# Patient Record
Sex: Male | Born: 1950 | Race: Black or African American | Marital: Single
Health system: Southern US, Community
[De-identification: ages and names within clinical notes are randomized; demographics above are authoritative.]

---

## 2021-04-05 ENCOUNTER — Inpatient Hospital Stay
Admission: EM | Admit: 2021-04-05 | Discharge: 2021-04-07 | DRG: 312 | Disposition: A | Discharge status: Home / Self Care | Payer: Medicare Other | Attending: Internal Medicine | Admitting: Internal Medicine

## 2021-04-05 ENCOUNTER — Emergency Department: Payer: Medicare Other

## 2021-04-05 DIAGNOSIS — W1839XA Other fall on same level, initial encounter: Secondary | ICD-10-CM | POA: Diagnosis present

## 2021-04-05 DIAGNOSIS — Z79899 Other long term (current) drug therapy: Secondary | ICD-10-CM

## 2021-04-05 DIAGNOSIS — I499 Cardiac arrhythmia, unspecified: Secondary | ICD-10-CM | POA: Diagnosis present

## 2021-04-05 DIAGNOSIS — F1490 Cocaine use, unspecified, uncomplicated: Secondary | ICD-10-CM | POA: Diagnosis present

## 2021-04-05 DIAGNOSIS — F10921 Alcohol use, unspecified with intoxication delirium: Secondary | ICD-10-CM | POA: Diagnosis not present

## 2021-04-05 DIAGNOSIS — D649 Anemia, unspecified: Secondary | ICD-10-CM | POA: Diagnosis present

## 2021-04-05 DIAGNOSIS — D72829 Elevated white blood cell count, unspecified: Secondary | ICD-10-CM | POA: Diagnosis present

## 2021-04-05 DIAGNOSIS — Z9842 Cataract extraction status, left eye: Secondary | ICD-10-CM

## 2021-04-05 DIAGNOSIS — N179 Acute kidney failure, unspecified: Secondary | ICD-10-CM | POA: Diagnosis not present

## 2021-04-05 DIAGNOSIS — E872 Acidosis: Secondary | ICD-10-CM | POA: Diagnosis not present

## 2021-04-05 DIAGNOSIS — I951 Orthostatic hypotension: Principal | ICD-10-CM | POA: Diagnosis present

## 2021-04-05 DIAGNOSIS — Y904 Blood alcohol level of 80-99 mg/100 ml: Secondary | ICD-10-CM | POA: Diagnosis present

## 2021-04-05 DIAGNOSIS — Y92511 Restaurant or cafe as the place of occurrence of the external cause: Secondary | ICD-10-CM

## 2021-04-05 DIAGNOSIS — R55 Syncope and collapse: Secondary | ICD-10-CM | POA: Diagnosis not present

## 2021-04-05 DIAGNOSIS — Z20822 Contact with and (suspected) exposure to covid-19: Secondary | ICD-10-CM | POA: Diagnosis present

## 2021-04-05 DIAGNOSIS — F10129 Alcohol abuse with intoxication, unspecified: Secondary | ICD-10-CM | POA: Diagnosis present

## 2021-04-05 DIAGNOSIS — R531 Weakness: Secondary | ICD-10-CM

## 2021-04-05 DIAGNOSIS — N1831 Chronic kidney disease, stage 3a: Secondary | ICD-10-CM | POA: Diagnosis present

## 2021-04-05 DIAGNOSIS — Z7982 Long term (current) use of aspirin: Secondary | ICD-10-CM

## 2021-04-05 DIAGNOSIS — Z8249 Family history of ischemic heart disease and other diseases of the circulatory system: Secondary | ICD-10-CM

## 2021-04-05 DIAGNOSIS — Z9841 Cataract extraction status, right eye: Secondary | ICD-10-CM

## 2021-04-05 DIAGNOSIS — I129 Hypertensive chronic kidney disease with stage 1 through stage 4 chronic kidney disease, or unspecified chronic kidney disease: Secondary | ICD-10-CM | POA: Diagnosis present

## 2021-04-05 DIAGNOSIS — I440 Atrioventricular block, first degree: Secondary | ICD-10-CM | POA: Diagnosis present

## 2021-04-05 LAB — COMPREHENSIVE METABOLIC PANEL
ALT: 13 U/L (ref 0–44)
AST: 17 U/L (ref 15–41)
Albumin: 3.3 g/dL — ABNORMAL LOW (ref 3.5–5.0)
Alkaline Phosphatase: 56 U/L (ref 38–126)
Anion gap: 11 (ref 5–15)
BUN: 35 mg/dL — ABNORMAL HIGH (ref 8–23)
CO2: 16 mmol/L — ABNORMAL LOW (ref 22–32)
Calcium: 8 mg/dL — ABNORMAL LOW (ref 8.9–10.3)
Chloride: 110 mmol/L (ref 98–111)
Creatinine, Ser: 1.76 mg/dL — ABNORMAL HIGH (ref 0.61–1.24)
GFR, Estimated: 42 mL/min — ABNORMAL LOW (ref >60)
Glucose, Bld: 102 mg/dL — ABNORMAL HIGH (ref 70–99)
Potassium: 3.9 mmol/L (ref 3.5–5.1)
Sodium: 137 mmol/L (ref 135–145)
Total Bilirubin: 0.7 mg/dL (ref 0.3–1.2)
Total Protein: 6.4 g/dL — ABNORMAL LOW (ref 6.5–8.1)

## 2021-04-05 LAB — ETHANOL: Alcohol, Ethyl (B): 91 mg/dL — ABNORMAL HIGH (ref <10)

## 2021-04-05 LAB — CBC
HCT: 38.4 % — ABNORMAL LOW (ref 39.0–52.0)
Hemoglobin: 11.9 g/dL — ABNORMAL LOW (ref 13.0–17.0)
MCH: 29.1 pg (ref 26.0–34.0)
MCHC: 31 g/dL (ref 30.0–36.0)
MCV: 93.9 fL (ref 80.0–100.0)
Platelets: 345 10*3/uL (ref 150–400)
RBC: 4.09 MIL/uL — ABNORMAL LOW (ref 4.22–5.81)
RDW: 14.5 % (ref 11.5–15.5)
WBC: 9.7 10*3/uL (ref 4.0–10.5)
nRBC: 0 % (ref 0.0–0.2)

## 2021-04-05 LAB — TROPONIN I (HIGH SENSITIVITY): Troponin I (High Sensitivity): 4 ng/L (ref <18)

## 2021-04-05 LAB — CBG MONITORING, ED: Glucose-Capillary: 109 mg/dL — ABNORMAL HIGH (ref 70–99)

## 2021-04-05 LAB — RESP PANEL BY RT-PCR (FLU A&B, COVID) ARPGX2
Influenza A by PCR: NEGATIVE
Influenza B by PCR: NEGATIVE
SARS Coronavirus 2 by RT PCR: NEGATIVE

## 2021-04-05 MED ORDER — NALOXONE HCL 2 MG/2ML IJ SOSY: 0.5000 mg | PREFILLED_SYRINGE | Freq: Once | INTRAMUSCULAR | Status: AC

## 2021-04-05 MED ORDER — SODIUM CHLORIDE 0.9 % IV BOLUS
1000.0000 mL | Freq: Once | INTRAVENOUS | Status: AC
  Administered 2021-04-05: 1000 mL via INTRAVENOUS

## 2021-04-05 MED ORDER — TRAZODONE HCL 50 MG PO TABS: 25.0000 mg | ORAL_TABLET | Freq: Every evening | ORAL | Status: DC | PRN

## 2021-04-05 MED ORDER — MAGNESIUM HYDROXIDE 400 MG/5ML PO SUSP: 30.0000 mL | Freq: Every day | ORAL | Status: DC | PRN

## 2021-04-05 MED ORDER — ACETAMINOPHEN 650 MG RE SUPP: 650.0000 mg | Freq: Four times a day (QID) | RECTAL | Status: DC | PRN

## 2021-04-05 MED ORDER — ONDANSETRON HCL 4 MG PO TABS: 4.0000 mg | ORAL_TABLET | Freq: Four times a day (QID) | ORAL | Status: DC | PRN

## 2021-04-05 MED ORDER — ASPIRIN EC 81 MG PO TBEC
81.0000 mg | DELAYED_RELEASE_TABLET | Freq: Every day | ORAL | Status: DC
  Administered 2021-04-06 – 2021-04-07 (×2): 81 mg via ORAL
  Filled 2021-04-05 (×2): qty 1

## 2021-04-05 MED ORDER — ACETAMINOPHEN 325 MG PO TABS
650.0000 mg | ORAL_TABLET | Freq: Four times a day (QID) | ORAL | Status: DC | PRN
  Administered 2021-04-06 – 2021-04-07 (×5): 650 mg via ORAL
  Filled 2021-04-05 (×5): qty 2

## 2021-04-05 MED ORDER — NALOXONE HCL 2 MG/2ML IJ SOSY
PREFILLED_SYRINGE | INTRAMUSCULAR | Status: AC
  Administered 2021-04-05: 0.5 mg via INTRAVENOUS
  Filled 2021-04-05: qty 2

## 2021-04-05 MED ORDER — ONDANSETRON HCL 4 MG/2ML IJ SOLN: 4.0000 mg | Freq: Four times a day (QID) | INTRAMUSCULAR | Status: DC | PRN

## 2021-04-05 MED ORDER — SODIUM BICARBONATE 8.4 % IV SOLN
INTRAVENOUS | Status: DC
  Filled 2021-04-05 (×4): qty 1000

## 2021-04-05 MED ORDER — HYDROCHLOROTHIAZIDE 25 MG PO TABS: 25.0000 mg | ORAL_TABLET | Freq: Every day | ORAL | Status: DC

## 2021-04-05 MED ORDER — ENOXAPARIN SODIUM 60 MG/0.6ML IJ SOSY
0.5000 mg/kg | PREFILLED_SYRINGE | INTRAMUSCULAR | Status: DC
  Administered 2021-04-06 – 2021-04-07 (×2): 47.5 mg via SUBCUTANEOUS
  Filled 2021-04-05 (×2): qty 0.6

## 2021-04-05 MED ORDER — SODIUM BICARBONATE 650 MG PO TABS
1300.0000 mg | ORAL_TABLET | Freq: Every day | ORAL | Status: DC
  Administered 2021-04-06 – 2021-04-07 (×2): 1300 mg via ORAL
  Filled 2021-04-05 (×2): qty 2

## 2021-04-05 NOTE — ED Triage Notes (Signed)
Pt [facility]a via [facility] from [facility] c/o of syncopal episode. No med hx available per pt. EMS reports bp on route ranged from 84/34 to 67/41. Most recent bp on arrival was 54/46.   Pt received 500 cc bolus pta.

## 2021-04-05 NOTE — ED Provider Notes (Signed)
-----------------------------------------   11:07 PM on 04/05/2021 -----------------------------------------  Assuming care from Dr. Lenard Lance.  In short, Travis Payne is a 70 y.o. male with a chief complaint of hypotension and syncope.  Refer to the original H&P for additional details.  The current plan of care is to follow up head CT and admit.   ----------------------------------------- 11:20 PM on 04/05/2021 -----------------------------------------  Head CT unremarkable.  Consulting hospitalist for admission.  Also added on procalcitonin.    ----------------------------------------- 11:33 PM on 04/05/2021 -----------------------------------------  Discussed case by phone with Dr. Arville Care who will admit.   Loleta Rose, MD 04/05/21 2333

## 2021-04-05 NOTE — ED Notes (Signed)
0.5 mg narcan given verbal order @ 2134.

## 2021-04-05 NOTE — ED Notes (Signed)
Please cal Alexine (sister-in-law) with update [phone] or [phone]

## 2021-04-05 NOTE — H&P (Addendum)
[place]   PATIENT NAME: Travis Payne    MR#:  [id number]  DATE OF BIRTH:  [birth year]  DATE OF ADMISSION:  04/05/2021  PRIMARY CARE PHYSICIAN: No primary care provider on file.   Patient is coming from: Home  REQUESTING/REFERRING PHYSICIAN: Loleta Rose, MD  CHIEF COMPLAINT:   Chief Complaint  Patient presents with   Hypotension    HISTORY OF PRESENT ILLNESS:  Travis Payne is a 70 y.o. African-American male with medical history significant for essential hypertension, who presented to emergency room with acute onset of syncope with subsequent fall at [facility] where he was with a friend.  Upon arrival of EMS the patient was still on the ground and was found to be hypotensive with a blood pressure of 70/40.  He was started on IV fluids and brought to the emergency room.  He was having ultimate status on route to the hospital.  He was still quite somnolent in the ER but will awaken to voice and answer questions and would not fall back asleep.  No chest pain or dyspnea or palpitations.  No cough or wheezing or hemoptysis.  No fever or chills.  No dysuria, oliguria or hematuria or flank pain.  No nausea or vomiting or abdominal pain.  Upon arrival to the ER, blood pressure was 74/43 with pulse currently 93% on 3 L of O2 by nasal cannula.  With hydration blood pressure was up to 97/47 and later 108/66.  Labs revealed a BUN of 35 and creatinine 1.76 with no previous levels for comparison and CO2 of 16.  Albumin was 3.3 and total protein 6.4.  High-sensitivity troponin was 4 and later 6.  CBC showed mild anemia.  Influenza antigens and COVID-19 PCR came back negative.  Alcohol level came back 91.  EKG as reviewed by me : EKG showed normal sinus rhythm with a rate of 69 and prolonged PR interval. Imaging: Noncontrasted head CT scan revealed no acute intracranial abnormalities.  The patient was given 0.5 mg of IV Narcan and 2 L bolus of IV normal saline.  He will be  admitted to an observation medically monitored bed for further evaluation and management. PAST MEDICAL HISTORY:  Essential hypertension.  PAST SURGICAL HISTORY:  Bilateral cataract extraction.  SOCIAL HISTORY:   Social History   Tobacco Use   Smoking status: Not on file   Smokeless tobacco: Not on file  Substance Use Topics   Alcohol use: Not on file  No history of tobacco EtOH abuse or illicit drug use.  FAMILY HISTORY:  Positive for hypertension.  DRUG ALLERGIES:  No Known Allergies  REVIEW OF SYSTEMS:   ROS As per history of present illness. All pertinent systems were reviewed above. Constitutional, HEENT, cardiovascular, respiratory, GI, GU, musculoskeletal, neuro, psychiatric, endocrine, integumentary and hematologic systems were reviewed and are otherwise negative/unremarkable except for positive findings mentioned above in the HPI.   MEDICATIONS AT HOME:   Prior to Admission medications   Medication Sig Start Date End Date Taking? Authorizing Provider  aspirin EC 81 MG tablet Take 81 mg by mouth daily. Swallow whole.   Yes [provider]  hydrochlorothiazide (HYDRODIURIL) 25 MG tablet Take 25 mg by mouth daily.   Yes [provider]  sodium bicarbonate 650 MG tablet Take 1,300 mg by mouth daily.   Yes [provider]      VITAL SIGNS:  Blood pressure (!) 97/47, pulse 78, temperature 97.8 F (36.6 C), temperature source Oral, resp.  rate 17, height 5\' 9"  (1.753 m), weight 94 kg, SpO2 98 %.  PHYSICAL EXAMINATION:  Physical Exam  GENERAL:  70 y.o.-year-old African-American male patient lying in the bed with no acute distress.  He was somnolent but easily arousable and would stay awake if engaged. EYES: Pupils equal, round, reactive to light and accommodation. No scleral icterus. Extraocular muscles intact.  HEENT: Head atraumatic, normocephalic. Oropharynx and nasopharynx clear.  NECK:  Supple, no jugular venous distention. No thyroid  enlargement, no tenderness.  LUNGS: Normal breath sounds bilaterally, no wheezing, rales,rhonchi or crepitation. No use of accessory muscles of respiration.  CARDIOVASCULAR: Regular rate and rhythm, S1, S2 normal. No murmurs, rubs, or gallops.  ABDOMEN: Soft, nondistended, nontender. Bowel sounds present. No organomegaly or mass.  EXTREMITIES: No pedal edema, cyanosis, or clubbing.  NEUROLOGIC: Cranial nerves II through XII are intact. Muscle strength 5/5 in all extremities. Sensation intact. Gait not checked.  PSYCHIATRIC: The patient is somnolent but arousable and when aroused he is alert and oriented x 3.  Normal affect and good eye contact. SKIN: No obvious rash, lesion, or ulcer.   LABORATORY PANEL:   CBC Recent Labs  Lab 04/05/21 2138  WBC 9.7  HGB 11.9*  HCT 38.4*  PLT 345   ------------------------------------------------------------------------------------------------------------------  Chemistries  Recent Labs  Lab 04/05/21 2138  NA 137  K 3.9  CL 110  CO2 16*  GLUCOSE 102*  BUN 35*  CREATININE 1.76*  CALCIUM 8.0*  AST 17  ALT 13  ALKPHOS 56  BILITOT 0.7   ------------------------------------------------------------------------------------------------------------------  Cardiac Enzymes No results for input(s): TROPONINI in the last 168 hours. ------------------------------------------------------------------------------------------------------------------  RADIOLOGY:  CT Head Wo Contrast  Result Date: 04/05/2021 CLINICAL DATA:  Syncope, hypotension EXAM: CT HEAD WITHOUT CONTRAST TECHNIQUE: Contiguous axial images were obtained from the base of the skull through the vertex without intravenous contrast. COMPARISON:  None. FINDINGS: Brain: No acute infarct or hemorrhage. Lateral ventricles and midline structures are unremarkable. No acute extra-axial fluid collections. No mass effect. Vascular: No hyperdense vessel or unexpected calcification. Skull: Normal.  Negative for fracture or focal lesion. Sinuses/Orbits: No acute finding. Other: None. IMPRESSION: 1. No acute intracranial process. Electronically Signed   By: 04/07/2021 M.D.   On: 04/05/2021 23:12      IMPRESSION AND PLAN:  Active Problems:   Syncope  1.  Syncope.  - The patient will be admitted to an observation medically monitored bed. - Will check orthostatics q 12 hours. - Will obtain a bilateral carotid Doppler and 2D echo. - The patient will be hydrated and monitored for arrhythmias. Differential diagnosis would include likely orthostatic hypotension, with other possibilities including neurally mediated syncope, cardiogenic, arrhythmias related,  and less likely hypoglycemia.  2.  Acute kidney injury with associated metabolic acidosis.  This could be possibly on top of chronic kidney disease.  The patient had associated hypotension that could exacerbate acute kidney injury. - The patient will be hydrated with IV bicarbonate drip for now and will follow BMP. - With improvement of his CO2, hydration can be converted to normal saline or lactated Ringer. - We will hold off HCTZ.  3.  Alcohol intoxication. - This is certainly contributing to the patient's somnolence. - We will monitor for alcohol withdrawal.  4.  Essential hypertension. - The patient will be placed on as needed IV labetalol and will hold off HCTZ.   DVT prophylaxis: Lovenox. Code Status: full code. Family Communication:  The plan of care was discussed in details with  the patient (and family). I answered all questions. The patient agreed to proceed with the above mentioned plan. Further management will depend upon hospital course. Disposition Plan: Back to previous home environment Consults called: none. All the records are reviewed and case discussed with ED provider.  Status is: Observation  The patient remains OBS appropriate and will d/c before 2 midnights.  Dispo: The patient is from: Home               Anticipated d/c is to: Home              Patient currently is not medically stable to d/c.   Difficult to place patient No  TOTAL TIME TAKING CARE OF THIS PATIENT: 55 minutes.    Hannah Beat M.D on 04/05/2021 at 11:45 PM  [facility]   From 7 PM-7 AM, contact night-coverage [email or web address]  CC: Primary care physician; No primary care provider on file.

## 2021-04-05 NOTE — ED Notes (Signed)
BS 109, MD notified at bedside.

## 2021-04-05 NOTE — ED Provider Notes (Signed)
[facility] Emergency Department Provider Note  Time seen: 9:45 PM  I have reviewed the triage vital signs and the nursing notes.   HISTORY  Chief Complaint Hypotension   HPI Travis Payne is a 70 y.o. male with no known past medical history presents to the emergency department for a fall/syncope from [facility].  According to report patient was at [employer] with a friend of his when he had a syncopal episode fell to the ground.  EMS arrived patient was still on the ground, found to be hypotensive as low as 70/40, started on IV fluids and brought to the emergency department.  They state patient was altered throughout the transportation.  Here the patient is quite somnolent, will awaken to voice and answer questions but then falls back asleep if not actively engaged.  Patient does not recall falling or the events surrounding the syncopal episode.  Patient's responses are quite slow and he is quite somnolent.  Denies any chest pain or abdominal pain denies any vomiting or diarrhea.   History reviewed. No pertinent past medical history.  There are no problems to display for this patient.   Prior to Admission medications   Not on File    Not on File  History reviewed. No pertinent family history.  Social History    Review of Systems Constitutional: Negative for fever. Cardiovascular: Negative for chest pain. Respiratory: Negative for shortness of breath. Gastrointestinal: Negative for abdominal pain, vomiting and diarrhea. Genitourinary: Negative for urinary compaints Musculoskeletal: Negative for musculoskeletal complaints Neurological: Negative for headache All other ROS negative, although likely limited given the patient's somnolence and mild confusion although he is oriented x3.  ____________________________________________   PHYSICAL EXAM:  VITAL SIGNS: ED Triage Vitals  Enc Vitals Group     BP 04/05/21 2137 (!) 74/43     Pulse Rate  04/05/21 2137 70     Resp 04/05/21 2137 17     Temp 04/05/21 2137 97.8 F (36.6 C)     Temp Source 04/05/21 2137 Oral     SpO2 04/05/21 2137 93 %     Weight 04/05/21 2139 207 lb 3.7 oz (94 kg)     Height 04/05/21 2139 5\' 9"  (1.753 m)     Head Circumference --      Peak Flow --      Pain Score 04/05/21 2139 0     Pain Loc --      Pain Edu? --      Excl. in GC? --    Constitutional: Somnolent does awaken to voice and answers questions although slow responses.  Initially stated the year is 2012 but corrected to 2022. Eyes: Normal exam ENT      Head: Normocephalic and atraumatic.      Mouth/Throat: Mucous membranes are moist. Cardiovascular: Normal rate, regular rhythm. Respiratory: Normal respiratory effort without tachypnea nor retractions. Breath sounds are clear Gastrointestinal: Soft and nontender. No distention.   Musculoskeletal: Nontender with normal range of motion in all extremities. Neurologic:  Normal speech and language. No gross focal neurologic deficits Skin:  Skin is warm, dry and intact.  Psychiatric: Mood and affect are normal.   ____________________________________________    EKG  EKG viewed and interpreted by myself shows a normal sinus rhythm at 69 bpm with a narrow QRS, normal axis, normal intervals besides slight PR prolongation.  Nonspecific ST changes.  ____________________________________________    RADIOLOGY  CT scan pending  ____________________________________________   INITIAL IMPRESSION / ASSESSMENT AND PLAN /  ED COURSE  Pertinent labs & imaging results that were available during my care of the patient were reviewed by me and considered in my medical decision making (see chart for details).   Patient presents to the emergency department from [facility] after a possible syncopal episode found on the ground, somnolent with hypotension.  Patient arrives continued hypotension as low as 55 systolic.  Open 2 L normal saline to bolus.  Patient  does have 2 mm pupils bilaterally we will dose Narcan to see if this helps with his condition.  We will obtain a CT scan of the head, labs including cardiac enzymes, and EKG, urinalysis and continue to closely monitor.  Patient's labs have resulted showing renal insufficiency, no old labs for comparison.  Given the renal insufficiency this could explain the patient's hypotension.  Patient received 2 L normal saline current blood pressure is 97/55.  We will dose an additional liter of fluids.  We will obtain CT imaging of the head as a precaution.  Patient is now much more awake alert.  Daughter is here with the patient who did not witness the syncopal event but states he was acting normal prior to leaving her house before he went to [employer].  Patient's troponin is negative.  Repeat troponin is pending.  Given the patient's profound initial hypotension and still fairly hypotensive despite IV resuscitation I believe patient will need to be admitted to the hospital for further work-up and treatment.  COVID/flu is negative.  Urinalysis pending.  CT scans pending.  Patient care signed out to oncoming provider.  Travis Payne was evaluated in Emergency Department on 04/05/2021 for the symptoms described in the history of present illness. He was evaluated in the context of the global COVID-19 pandemic, which necessitated consideration that the patient might be at risk for infection with the SARS-CoV-2 virus that causes COVID-19. Institutional protocols and algorithms that pertain to the evaluation of patients at risk for COVID-19 are in a state of rapid change based on information released by regulatory bodies including the CDC and federal and state organizations. These policies and algorithms were followed during the patient's care in the ED.  ____________________________________________   FINAL CLINICAL IMPRESSION(S) / ED DIAGNOSES  Altered mental status Hypotension Somnolence   Minna Antis, MD 04/05/21 2254

## 2021-04-05 NOTE — Progress Notes (Signed)
PHARMACIST - PHYSICIAN COMMUNICATION  CONCERNING:  Enoxaparin (Lovenox) for DVT Prophylaxis    RECOMMENDATION: Patient was prescribed enoxaprin 40mg  q24 hours for VTE prophylaxis.   Filed Weights   04/05/21 2139  Weight: 94 kg (207 lb 3.7 oz)    Body mass index is 30.6 kg/m.  Estimated Creatinine Clearance: 46.1 mL/min (A) (by C-G formula based on SCr of 1.76 mg/dL (H)).   Based on [facility] policy patient is candidate for enoxaparin 0.5mg /kg TBW SQ every 24 hours based on BMI being >30.  DESCRIPTION: Pharmacy has adjusted enoxaparin dose per [facility] policy.  Patient is now receiving enoxaparin 0.5 mg/kg every 24 hours   [facility], PharmD, [facility] 04/05/2021 11:41 PM

## 2021-04-06 ENCOUNTER — Observation Stay: Payer: Medicare Other

## 2021-04-06 ENCOUNTER — Observation Stay
Admit: 2021-04-06 | Discharge: 2021-04-06 | Disposition: A | Discharge status: Home / Self Care | Payer: Medicare Other | Attending: Family Medicine | Admitting: Family Medicine

## 2021-04-06 DIAGNOSIS — D72829 Elevated white blood cell count, unspecified: Secondary | ICD-10-CM | POA: Diagnosis present

## 2021-04-06 DIAGNOSIS — F1410 Cocaine abuse, uncomplicated: Secondary | ICD-10-CM | POA: Diagnosis not present

## 2021-04-06 DIAGNOSIS — W1839XA Other fall on same level, initial encounter: Secondary | ICD-10-CM | POA: Diagnosis present

## 2021-04-06 DIAGNOSIS — N1831 Chronic kidney disease, stage 3a: Secondary | ICD-10-CM | POA: Diagnosis present

## 2021-04-06 DIAGNOSIS — I129 Hypertensive chronic kidney disease with stage 1 through stage 4 chronic kidney disease, or unspecified chronic kidney disease: Secondary | ICD-10-CM | POA: Diagnosis present

## 2021-04-06 DIAGNOSIS — I440 Atrioventricular block, first degree: Secondary | ICD-10-CM | POA: Diagnosis present

## 2021-04-06 DIAGNOSIS — F1490 Cocaine use, unspecified, uncomplicated: Secondary | ICD-10-CM

## 2021-04-06 DIAGNOSIS — F10129 Alcohol abuse with intoxication, unspecified: Secondary | ICD-10-CM | POA: Diagnosis present

## 2021-04-06 DIAGNOSIS — I951 Orthostatic hypotension: Secondary | ICD-10-CM | POA: Diagnosis present

## 2021-04-06 DIAGNOSIS — R55 Syncope and collapse: Secondary | ICD-10-CM

## 2021-04-06 DIAGNOSIS — Z9842 Cataract extraction status, left eye: Secondary | ICD-10-CM | POA: Diagnosis not present

## 2021-04-06 DIAGNOSIS — E872 Acidosis: Secondary | ICD-10-CM

## 2021-04-06 DIAGNOSIS — N179 Acute kidney failure, unspecified: Secondary | ICD-10-CM | POA: Diagnosis present

## 2021-04-06 DIAGNOSIS — I499 Cardiac arrhythmia, unspecified: Secondary | ICD-10-CM | POA: Diagnosis present

## 2021-04-06 DIAGNOSIS — Y92511 Restaurant or cafe as the place of occurrence of the external cause: Secondary | ICD-10-CM | POA: Diagnosis not present

## 2021-04-06 DIAGNOSIS — Z9841 Cataract extraction status, right eye: Secondary | ICD-10-CM | POA: Diagnosis not present

## 2021-04-06 DIAGNOSIS — Z79899 Other long term (current) drug therapy: Secondary | ICD-10-CM | POA: Diagnosis not present

## 2021-04-06 DIAGNOSIS — D649 Anemia, unspecified: Secondary | ICD-10-CM | POA: Diagnosis present

## 2021-04-06 DIAGNOSIS — Z8249 Family history of ischemic heart disease and other diseases of the circulatory system: Secondary | ICD-10-CM | POA: Diagnosis not present

## 2021-04-06 DIAGNOSIS — Y904 Blood alcohol level of 80-99 mg/100 ml: Secondary | ICD-10-CM | POA: Diagnosis present

## 2021-04-06 DIAGNOSIS — Z7982 Long term (current) use of aspirin: Secondary | ICD-10-CM | POA: Diagnosis not present

## 2021-04-06 DIAGNOSIS — Z20822 Contact with and (suspected) exposure to covid-19: Secondary | ICD-10-CM | POA: Diagnosis present

## 2021-04-06 LAB — BASIC METABOLIC PANEL
Anion gap: 7 (ref 5–15)
BUN: 30 mg/dL — ABNORMAL HIGH (ref 8–23)
CO2: 20 mmol/L — ABNORMAL LOW (ref 22–32)
Calcium: 8.1 mg/dL — ABNORMAL LOW (ref 8.9–10.3)
Chloride: 111 mmol/L (ref 98–111)
Creatinine, Ser: 1.42 mg/dL — ABNORMAL HIGH (ref 0.61–1.24)
GFR, Estimated: 54 mL/min — ABNORMAL LOW (ref >60)
Glucose, Bld: 91 mg/dL (ref 70–99)
Potassium: 4.9 mmol/L (ref 3.5–5.1)
Sodium: 138 mmol/L (ref 135–145)

## 2021-04-06 LAB — URINALYSIS, ROUTINE W REFLEX MICROSCOPIC
Bacteria, UA: NONE SEEN
Bilirubin Urine: NEGATIVE
Glucose, UA: NEGATIVE mg/dL
Hgb urine dipstick: NEGATIVE
Ketones, ur: NEGATIVE mg/dL
Leukocytes,Ua: NEGATIVE
Nitrite: NEGATIVE
Protein, ur: 30 mg/dL — AB
Specific Gravity, Urine: 1.013 (ref 1.005–1.030)
pH: 6 (ref 5.0–8.0)

## 2021-04-06 LAB — ECHOCARDIOGRAM COMPLETE
AR max vel: 2.39 cm2
AV Area VTI: 2.7 cm2
AV Area mean vel: 2.5 cm2
AV Mean grad: 6 mmHg
AV Peak grad: 10.8 mmHg
Ao pk vel: 1.64 m/s
Area-P 1/2: 3.01 cm2
Height: 69 in
S' Lateral: 2.54 cm
Weight: 3232 oz

## 2021-04-06 LAB — CBC
HCT: 37 % — ABNORMAL LOW (ref 39.0–52.0)
Hemoglobin: 12.4 g/dL — ABNORMAL LOW (ref 13.0–17.0)
MCH: 31.2 pg (ref 26.0–34.0)
MCHC: 33.5 g/dL (ref 30.0–36.0)
MCV: 93 fL (ref 80.0–100.0)
Platelets: 321 10*3/uL (ref 150–400)
RBC: 3.98 MIL/uL — ABNORMAL LOW (ref 4.22–5.81)
RDW: 14.4 % (ref 11.5–15.5)
WBC: 19.6 10*3/uL — ABNORMAL HIGH (ref 4.0–10.5)
nRBC: 0 % (ref 0.0–0.2)

## 2021-04-06 LAB — TROPONIN I (HIGH SENSITIVITY): Troponin I (High Sensitivity): 6 ng/L (ref <18)

## 2021-04-06 LAB — URINE DRUG SCREEN, QUALITATIVE (ARMC ONLY)
Amphetamines, Ur Screen: NOT DETECTED
Barbiturates, Ur Screen: NOT DETECTED
Benzodiazepine, Ur Scrn: NOT DETECTED
Cannabinoid 50 Ng, Ur ~~LOC~~: NOT DETECTED
Cocaine Metabolite,Ur ~~LOC~~: POSITIVE — AB
MDMA (Ecstasy)Ur Screen: NOT DETECTED
Methadone Scn, Ur: NOT DETECTED
Opiate, Ur Screen: NOT DETECTED
Phencyclidine (PCP) Ur S: NOT DETECTED
Tricyclic, Ur Screen: NOT DETECTED

## 2021-04-06 LAB — HIV ANTIBODY (ROUTINE TESTING W REFLEX): HIV Screen 4th Generation wRfx: NONREACTIVE

## 2021-04-06 LAB — PROCALCITONIN: Procalcitonin: <0.1 ng/mL

## 2021-04-06 MED ORDER — LORAZEPAM 2 MG/ML IJ SOLN: 1.0000 mg | INTRAMUSCULAR | Status: DC | PRN

## 2021-04-06 MED ORDER — SODIUM BICARBONATE 8.4 % IV SOLN
INTRAVENOUS | Status: DC
  Filled 2021-04-06 (×2): qty 1000

## 2021-04-06 MED ORDER — SODIUM BICARBONATE 8.4 % IV SOLN
INTRAVENOUS | Status: DC
  Filled 2021-04-06: qty 150
  Filled 2021-04-06 (×3): qty 1000

## 2021-04-06 NOTE — Plan of Care (Signed)
  Problem: Elimination: Goal: Will not experience complications related to bowel motility Outcome: Progressing Goal: Will not experience complications related to urinary retention Outcome: Progressing   

## 2021-04-06 NOTE — Progress Notes (Signed)
   04/06/21 1138  Orthostatic Lying   BP- Lying 138/83  Pulse- Lying 84  Orthostatic Sitting  BP- Sitting 145/81  Pulse- Sitting 86  Orthostatic Standing at 0 minutes  BP- Standing at 0 minutes 127/81  Pulse- Standing at 0 minutes 95  Orthostatic Standing at 3 minutes  BP- Standing at 3 minutes 137/84  Pulse- Standing at 3 minutes 93

## 2021-04-06 NOTE — ED Notes (Signed)
Pt transported to room 216 by this RN.

## 2021-04-06 NOTE — ED Notes (Signed)
Travis Payne- daughter- requests call when pt has inpatient room. Number is [phone].

## 2021-04-06 NOTE — Progress Notes (Signed)
PROGRESS NOTE    Travis Payne  SWN:[id number] DOB: 1951/01/28 DOA: 04/05/2021 PCP: No primary care provider on file.  Assessment & Plan:   Active Problems:   Syncope  Syncope: etiology unclear, possibly arrhythmias vs orthostasis vs cocaine use. [place] b/l carotids show b/l carotid bifurcation plaque resulting in less than 50% diameter. Echo ordered. 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 Cr is trending down from day prior. Will continue to monitor   Metabolic acidosis: trending up from day prior. 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 Illicit drug use cessation counseling   Leukocytosis: etiology unclear. Reactive vs infectious. No fevers. Will hold on abxs at this time and monitor   Alcohol intoxication: alcohol cessation counseling   HTN: will continue to hold home dose of HCTZ. IV labetalol    DVT prophylaxis: lovenox  Code Status: full Family Communication: Disposition Plan: likely d/c back home   Level of care: Med-Surg  Status is: Inpatient  Remains inpatient appropriate because:IV treatments appropriate due to intensity of illness or inability to take PO and Inpatient level of care appropriate due to severity of illness  Dispo: The patient is from: Home              Anticipated d/c is to: Home              Patient currently is not medically stable to d/c.   Difficult to place patient: unclear    Consultants:    Procedures:   Antimicrobials:   Subjective: Pt c/o fatigue   Objective: Vitals:   04/06/21 0200 04/06/21 0230 04/06/21 0337 04/06/21 0455  BP: (!) 94/47 99/76 111/77 (!) 143/94  Pulse:   86 93  Resp: 15  15 16   Temp:   98.4 F (36.9 C) 98 F (36.7 C)  TempSrc:   Oral Oral  SpO2:   96% 98%  Weight:    91.6 kg  Height:    5\' 9"  (1.753 m)    Intake/Output Summary (Last 24 hours) at 04/06/2021 0754 Last data filed at 04/06/2021 0500 Gross per 24 hour  Intake  379.19 ml  Output 325 ml  Net 54.19 ml   Filed Weights   04/05/21 2139 04/06/21 0455  Weight: 94 kg 91.6 kg    Examination:  General exam: Appears calm and comfortable  Respiratory system: diminished breath sounds b/l  Cardiovascular system: S1 & S2 +. No  rubs, gallops or clicks.  Gastrointestinal system: Abdomen is nondistended, soft and nontender. Normal bowel sounds heard. Central nervous system: Alert and oriented. Moves all extremities  Psychiatry: Judgement and insight appear normal. Mood & affect appropriate.     Data Reviewed: I have personally reviewed following labs and imaging studies  CBC: Recent Labs  Lab 04/05/21 2138 04/06/21 0510  WBC 9.7 19.6*  HGB 11.9* 12.4*  HCT 38.4* 37.0*  MCV 93.9 93.0  PLT 345 321   Basic Metabolic Panel: Recent Labs  Lab 04/05/21 2138 04/06/21 0510  NA 137 138  K 3.9 4.9  CL 110 111  CO2 16* 20*  GLUCOSE 102* 91  BUN 35* 30*  CREATININE 1.76* 1.42*  CALCIUM 8.0* 8.1*   GFR: Estimated Creatinine Clearance: 56.5 mL/min (A) (by C-G formula based on SCr of 1.42 mg/dL (H)). Liver Function Tests: Recent Labs  Lab 04/05/21 2138  AST 17  ALT 13  ALKPHOS 56  BILITOT 0.7  PROT 6.4*  ALBUMIN 3.3*  No results for input(s): LIPASE, AMYLASE in the last 168 hours. No results for input(s): AMMONIA in the last 168 hours. Coagulation Profile: No results for input(s): INR, PROTIME in the last 168 hours. Cardiac Enzymes: No results for input(s): CKTOTAL, CKMB, CKMBINDEX, TROPONINI in the last 168 hours. BNP (last 3 results) No results for input(s): PROBNP in the last 8760 hours. HbA1C: No results for input(s): HGBA1C in the last 72 hours. CBG: Recent Labs  Lab 04/05/21 2133  GLUCAP 109*   Lipid Profile: No results for input(s): CHOL, HDL, LDLCALC, TRIG, CHOLHDL, LDLDIRECT in the last 72 hours. Thyroid Function Tests: No results for input(s): TSH, T4TOTAL, FREET4, T3FREE, THYROIDAB in the last 72 hours. Anemia  Panel: No results for input(s): VITAMINB12, FOLATE, FERRITIN, TIBC, IRON, RETICCTPCT in the last 72 hours. Sepsis Labs: Recent Labs  Lab 04/05/21 2351  PROCALCITON <0.10    Recent Results (from the past 240 hour(s))  Resp Panel by RT-PCR (Flu A&B, Covid) Nasopharyngeal Swab     Status: None   Collection Time: 04/05/21  9:38 PM   Specimen: Nasopharyngeal Swab; Nasopharyngeal(NP) swabs in vial transport medium  Result Value Ref Range Status   SARS Coronavirus 2 by RT PCR NEGATIVE NEGATIVE Final    Comment: (NOTE) SARS-CoV-2 target nucleic acids are NOT DETECTED.  The SARS-CoV-2 RNA is generally detectable in upper respiratory specimens during the acute phase of infection. The lowest concentration of SARS-CoV-2 viral copies this assay can detect is 138 copies/mL. A negative result does not preclude SARS-Cov-2 infection and should not be used as the sole basis for treatment or other patient management decisions. A negative result may occur with  improper specimen collection/handling, submission of specimen other than nasopharyngeal swab, presence of viral mutation(s) within the areas targeted by this assay, and inadequate number of viral copies(<138 copies/mL). A negative result must be combined with clinical observations, patient history, and epidemiological information. The expected result is Negative.  Fact Sheet for Patients:  BloggerCourse.com  Fact Sheet for Healthcare Providers:  SeriousBroker.it  This test is no t yet approved or cleared by the [place] FDA and  has been authorized for detection and/or diagnosis of SARS-CoV-2 by FDA under an Emergency Use Authorization (EUA). This EUA will remain  in effect (meaning this test can be used) for the duration of the COVID-19 declaration under Section 564(b)(1) of the Act, 21 U.S.C.section 360bbb-3(b)(1), unless the authorization is terminated  or revoked sooner.        Influenza A by PCR NEGATIVE NEGATIVE Final   Influenza B by PCR NEGATIVE NEGATIVE Final    Comment: (NOTE) The Xpert Xpress SARS-CoV-2/FLU/RSV plus assay is intended as an aid in the diagnosis of influenza from Nasopharyngeal swab specimens and should not be used as a sole basis for treatment. Nasal washings and aspirates are unacceptable for Xpert Xpress SARS-CoV-2/FLU/RSV testing.  Fact Sheet for Patients: BloggerCourse.com  Fact Sheet for Healthcare Providers: SeriousBroker.it  This test is not yet approved or cleared by the [place] FDA and has been authorized for detection and/or diagnosis of SARS-CoV-2 by FDA under an Emergency Use Authorization (EUA). This EUA will remain in effect (meaning this test can be used) for the duration of the COVID-19 declaration under Section 564(b)(1) of the Act, 21 U.S.C. section 360bbb-3(b)(1), unless the authorization is terminated or revoked.  Performed at [facility], [address], [place], [place] [address]          Radiology Studies: CT Head Wo Contrast  Result  Date: 04/05/2021 CLINICAL DATA:  Syncope, hypotension EXAM: CT HEAD WITHOUT CONTRAST TECHNIQUE: Contiguous axial images were obtained from the base of the skull through the vertex without intravenous contrast. COMPARISON:  None. FINDINGS: Brain: No acute infarct or hemorrhage. Lateral ventricles and midline structures are unremarkable. No acute extra-axial fluid collections. No mass effect. Vascular: No hyperdense vessel or unexpected calcification. Skull: Normal. Negative for fracture or focal lesion. Sinuses/Orbits: No acute finding. Other: None. IMPRESSION: 1. No acute intracranial process. Electronically Signed   By: Sharlet Salina M.D.   On: 04/05/2021 23:12        Scheduled Meds:  aspirin EC  81 mg Oral Daily   enoxaparin (LOVENOX) injection  0.5 mg/kg Subcutaneous Q24H   sodium bicarbonate  1,300  mg Oral Daily   Continuous Infusions:  dextrose 5 % 1,000 mL with sodium bicarbonate 150 mEq infusion 100 mL/hr at 04/06/21 0031     LOS: 0 days    Time spent: 33 mins    Charise Killian, MD [facility] Pager [phone]  If 7PM-7AM, please contact night-coverage 04/06/2021, 7:54 AM

## 2021-04-06 NOTE — ED Notes (Signed)
This RN to bedside, introduced self to patient. Pt alert and oriented. Pt placed back on cardiac monitor. Urinal emptied. Call bell remains within reach of patient at this time. Pt denies further needs.

## 2021-04-06 NOTE — ED Notes (Signed)
Pt assisted to toilet at this time 

## 2021-04-07 DIAGNOSIS — R55 Syncope and collapse: Secondary | ICD-10-CM | POA: Diagnosis not present

## 2021-04-07 DIAGNOSIS — N179 Acute kidney failure, unspecified: Secondary | ICD-10-CM

## 2021-04-07 DIAGNOSIS — F1410 Cocaine abuse, uncomplicated: Secondary | ICD-10-CM | POA: Diagnosis not present

## 2021-04-07 LAB — BASIC METABOLIC PANEL
Anion gap: 6 (ref 5–15)
BUN: 21 mg/dL (ref 8–23)
CO2: 25 mmol/L (ref 22–32)
Calcium: 8.7 mg/dL — ABNORMAL LOW (ref 8.9–10.3)
Chloride: 105 mmol/L (ref 98–111)
Creatinine, Ser: 1.38 mg/dL — ABNORMAL HIGH (ref 0.61–1.24)
GFR, Estimated: 56 mL/min — ABNORMAL LOW (ref >60)
Glucose, Bld: 96 mg/dL (ref 70–99)
Potassium: 3.8 mmol/L (ref 3.5–5.1)
Sodium: 136 mmol/L (ref 135–145)

## 2021-04-07 LAB — CBC
HCT: 36.4 % — ABNORMAL LOW (ref 39.0–52.0)
Hemoglobin: 12.1 g/dL — ABNORMAL LOW (ref 13.0–17.0)
MCH: 29.7 pg (ref 26.0–34.0)
MCHC: 33.2 g/dL (ref 30.0–36.0)
MCV: 89.4 fL (ref 80.0–100.0)
Platelets: 311 10*3/uL (ref 150–400)
RBC: 4.07 MIL/uL — ABNORMAL LOW (ref 4.22–5.81)
RDW: 14.3 % (ref 11.5–15.5)
WBC: 11.1 10*3/uL — ABNORMAL HIGH (ref 4.0–10.5)
nRBC: 0 % (ref 0.0–0.2)

## 2021-04-07 NOTE — Discharge Summary (Signed)
Physician Discharge Summary  Travis Payne DQQ:[id number] DOB: Jan 11, 1951 DOA: 04/05/2021  PCP: Pcp, No  Admit date: 04/05/2021 Discharge date: 04/07/2021  Admitted From: home Disposition:  home   Recommendations for Outpatient Follow-up:  Follow up with PCP in 1-2 weeks   Home Health: no Equipment/Devices: n/a  Discharge Condition: stable  CODE STATUS: full Diet recommendation: Heart Healthy   Brief/Interim Summary: HPI was taken from Dr. Arville Care: Travis Payne is a 70 y.o. African-American male with medical history significant for essential hypertension, who presented to emergency room with acute onset of syncope with subsequent fall at [facility] where he was with a friend.  Upon arrival of EMS the patient was still on the ground and was found to be hypotensive with a blood pressure of 70/40.  He was started on IV fluids and brought to the emergency room.  He was having ultimate status on route to the hospital.  He was still quite somnolent in the ER but will awaken to voice and answer questions and would not fall back asleep.  No chest pain or dyspnea or palpitations.  No cough or wheezing or hemoptysis.  No fever or chills.  No dysuria, oliguria or hematuria or flank pain.  No nausea or vomiting or abdominal pain.   Upon arrival to the ER, blood pressure was 74/43 with pulse currently 93% on 3 L of O2 by nasal cannula.  With hydration blood pressure was up to 97/47 and later 108/66.  Labs revealed a BUN of 35 and creatinine 1.76 with no previous levels for comparison and CO2 of 16.  Albumin was 3.3 and total protein 6.4.  High-sensitivity troponin was 4 and later 6.  CBC showed mild anemia.  Influenza antigens and COVID-19 PCR came back negative.  Alcohol level came back 91.   EKG as reviewed by me : EKG showed normal sinus rhythm with a rate of 69 and prolonged PR interval. Imaging: Noncontrasted head CT scan revealed no acute intracranial abnormalities.  The patient was given  0.5 mg of IV Narcan and 2 L bolus of IV normal saline.  He will be admitted to an observation medically monitored bed for further evaluation and management.  Hospital course from Dr. Mayford Knife 7/17-18/22: Pt presented after a syncopal episode. [place] b/l carotids showed b/l carotid bifurcation plaque resulting in less than 50% diameter. Echo shows EF 70-75%, diastolic function is normal, and no atrial level shunt detected. Of note, urine drug screen was positive for cocaine & also admitted to drinking alcohol at the same time. Pt received illicit drug use cessation counseling. Pt declined any drug abuse resources. Syncopal episode was likely secondary to cocaine and alcohol use simultaneously. For more information, please see previous progress notes.   Discharge Diagnoses:  Active Problems:   Syncope  Syncope: etiology unclear, possibly arrhythmias vs orthostasis vs cocaine use. [place] b/l carotids show b/l carotid bifurcation plaque resulting in less than 50% diameter. Echo shows EF o 70-75%, normal diastolic function, & no atrial level shunt detected. 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 Illicit drug use cessation counseling    Leukocytosis: etiology unclear. WBC is trending down, likely reactive    Alcohol intoxication: alcohol cessation counseling   HTN: restart home dose of HCTZ. IV labetalol prn   Discharge Instructions  Discharge Instructions  Diet - low sodium heart healthy   Complete by: As directed    Discharge instructions   Complete by: As directed    F/u w/ PCP in 1-2 weeks   Increase activity slowly   Complete by: As directed       Allergies as of 04/07/2021   No Known Allergies      Medication List     TAKE these medications    aspirin EC 81 MG tablet Take 81 mg by mouth daily. Swallow whole.    hydrochlorothiazide 25 MG tablet Commonly known as: HYDRODIURIL Take 25 mg by mouth daily.   sodium bicarbonate 650 MG tablet Take 1,300 mg by mouth daily.        No Known Allergies  Consultations:    Procedures/Studies: CT Head Wo Contrast  Result Date: 04/05/2021 CLINICAL DATA:  Syncope, hypotension EXAM: CT HEAD WITHOUT CONTRAST TECHNIQUE: Contiguous axial images were obtained from the base of the skull through the vertex without intravenous contrast. COMPARISON:  None. FINDINGS: Brain: No acute infarct or hemorrhage. Lateral ventricles and midline structures are unremarkable. No acute extra-axial fluid collections. No mass effect. Vascular: No hyperdense vessel or unexpected calcification. Skull: Normal. Negative for fracture or focal lesion. Sinuses/Orbits: No acute finding. Other: None. IMPRESSION: 1. No acute intracranial process. Electronically Signed   By: Sharlet SalinaMichael  Brown M.D.   On: 04/05/2021 23:12   US Carotid Bilateral  Result Date: 04/06/2021 CLINICAL DATA:  Syncope, hypertension, hyperlipidemia EXAM: BILATERAL CAROTID DUPLEX ULTRASOUND TECHNIQUE: Wallace CullensGray scale imaging, color Doppler and duplex ultrasound were performed of bilateral carotid and vertebral arteries in the neck. COMPARISON:  None. FINDINGS: Criteria: Quantification of carotid stenosis is based on velocity parameters that correlate the residual internal carotid diameter with NASCET-based stenosis levels, using the diameter of the distal internal carotid lumen as the denominator for stenosis measurement. The following velocity measurements were obtained: RIGHT ICA: 76/21 cm/sec CCA: 105/28 cm/sec SYSTOLIC ICA/CCA RATIO:  0.7 ECA: 63 cm/sec LEFT ICA: 97/35 cm/sec CCA: 85/15 cm/sec SYSTOLIC ICA/CCA RATIO:  1.1 ECA: 95  Cm/sec RIGHT CAROTID ARTERY: Mild plaque in the bulb and ICA origin with only mild stenosis. Normal waveforms and color Doppler signal throughout. RIGHT VERTEBRAL ARTERY:  Normal flow direction and  waveform. LEFT CAROTID ARTERY: Mild plaque in the bulb and proximal ICA resulting in mild stenosis. Normal waveforms and color Doppler signal throughout. LEFT VERTEBRAL ARTERY:  Normal flow direction and waveform. IMPRESSION: 1. 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 2. Antegrade bilateral vertebral arterial flow. Electronically Signed   By: Corlis Leak  Hassell M.D.   On: 04/06/2021 12:26   ECHOCARDIOGRAM COMPLETE  Result Date: 04/06/2021    ECHOCARDIOGRAM REPORT   Patient Name:   Travis Payne Date of Exam: 04/06/2021 Medical Rec #:  [id number]       Height:       69.0 in Accession #:    [id number]      Weight:       202.0 lb Date of Birth:  [birth year]      BSA:          2.075 m Patient Age:    69 years        BP:           143/90 mmHg Patient Gender: M               HR:           85 bpm. Exam Location:  [facility] Procedure: 2D  Echo Indications:     Syncope  History:         Patient has no prior history of Echocardiogram examinations.                  Risk Factors:Hypertension.  Sonographer:     L Thornton-Maynard Referring Phys:  [id number] Vernetta Honey MANSY Diagnosing Phys: Alwyn Pea MD IMPRESSIONS  1. Techically difficult study.  2. Left ventricular ejection fraction, by estimation, is 70 to 75%. The left ventricle has hyperdynamic function. The left ventricle has no regional wall motion abnormalities. Left ventricular diastolic parameters were normal.  3. Right ventricular systolic function is normal. The right ventricular size is normal.  4. The mitral valve is normal in structure. Trivial mitral valve regurgitation.  5. The aortic valve is normal in structure. Aortic valve regurgitation is not visualized. Conclusion(s)/Recommendation(s): Poor windows for evaluation of left ventricular function by transthoracic echocardiography. Would recommend an alternative means of evaluation. FINDINGS  Left Ventricle: Left ventricular ejection fraction, by estimation, is 70 to 75%. The  left ventricle has hyperdynamic function. The left ventricle has no regional wall motion abnormalities. The left ventricular internal cavity size was normal in size. There is borderline left ventricular hypertrophy. Left ventricular diastolic parameters were normal. Right Ventricle: The right ventricular size is normal. No increase in right ventricular wall thickness. Right ventricular systolic function is normal. Left Atrium: Left atrial size was normal in size. Right Atrium: Right atrial size was normal in size. Pericardium: There is no evidence of pericardial effusion. Mitral Valve: The mitral valve is normal in structure. Trivial mitral valve regurgitation. Tricuspid Valve: The tricuspid valve is normal in structure. Tricuspid valve regurgitation is trivial. Aortic Valve: The aortic valve is normal in structure. Aortic valve regurgitation is not visualized. Aortic valve mean gradient measures 6.0 mmHg. Aortic valve peak gradient measures 10.8 mmHg. Aortic valve area, by VTI measures 2.70 cm. Pulmonic Valve: The pulmonic valve was normal in structure. Pulmonic valve regurgitation is not visualized. Aorta: The ascending aorta was not well visualized. IAS/Shunts: No atrial level shunt detected by color flow Doppler. Additional Comments: Techically difficult study.  LEFT VENTRICLE PLAX 2D LVIDd:         4.22 cm  Diastology LVIDs:         2.54 cm  LV e' medial:    6.53 cm/s LV PW:         1.17 cm  LV E/e' medial:  10.8 LV IVS:        1.18 cm  LV e' lateral:   7.62 cm/s LVOT diam:     1.90 cm  LV E/e' lateral: 9.2 LV SV:         73 LV SV Index:   35 LVOT Area:     2.84 cm  RIGHT VENTRICLE RV S prime:     14.10 cm/s TAPSE (M-mode): 2.4 cm LEFT ATRIUM             Index LA diam:        3.20 cm 1.54 cm/m LA Vol (A2C):   31.8 ml 15.33 ml/m LA Vol (A4C):   33.4 ml 16.10 ml/m LA Biplane Vol: 33.2 ml 16.00 ml/m  AORTIC VALVE                    PULMONIC VALVE AV Area (Vmax):    2.39 cm     PV Vmax:       1.21 m/s AV  Area (Vmean):  2.50 cm     PV Peak grad:  5.9 mmHg AV Area (VTI):     2.70 cm AV Vmax:           164.00 cm/s AV Vmean:          112.000 cm/s AV VTI:            0.272 m AV Peak Grad:      10.8 mmHg AV Mean Grad:      6.0 mmHg LVOT Vmax:         138.00 cm/s LVOT Vmean:        98.600 cm/s LVOT VTI:          0.259 m LVOT/AV VTI ratio: 0.95  AORTA Ao Root diam: 3.60 cm MITRAL VALVE MV Area (PHT): 3.01 cm    SHUNTS MV E velocity: 70.30 cm/s  Systemic VTI:  0.26 m MV A velocity: 72.40 cm/s  Systemic Diam: 1.90 cm MV E/A ratio:  0.97 Dwayne D Callwood MD Electronically signed by Alwyn Pea MD Signature Date/Time: 04/06/2021/11:40:25 AM    Final    (Echo, Carotid, EGD, Colonoscopy, ERCP)    Subjective: Pt c/o fatigue    Discharge Exam: Vitals:   04/07/21 0536 04/07/21 0825  BP: 130/67 136/70  Pulse: 66   Resp: 16   Temp: 98.6 F (37 C) 98.1 F (36.7 C)  SpO2: 99%    Vitals:   04/06/21 1058 04/06/21 2025 04/07/21 0536 04/07/21 0825  BP: 138/85 132/67 130/67 136/70  Pulse: 81 85 66   Resp: 16 20 16    Temp: 99 F (37.2 C) 98.9 F (37.2 C) 98.6 F (37 C) 98.1 F (36.7 C)  TempSrc: Oral Oral Oral Oral  SpO2: 98% 98% 99%   Weight:      Height:        General: Pt is alert, awake, not in acute distress Cardiovascular: S1/S2 +, no rubs, no gallops Respiratory: CTA bilaterally, no wheezing, no rhonchi Abdominal: Soft, NT, ND, bowel sounds + Extremities: no edema, no cyanosis    The results of significant diagnostics from this hospitalization (including imaging, microbiology, ancillary and laboratory) are listed below for reference.     Microbiology: Recent Results (from the past 240 hour(s))  Resp Panel by RT-PCR (Flu A&B, Covid) Nasopharyngeal Swab     Status: None   Collection Time: 04/05/21  9:38 PM   Specimen: Nasopharyngeal Swab; Nasopharyngeal(NP) swabs in vial transport medium  Result Value Ref Range Status   SARS Coronavirus 2 by RT PCR NEGATIVE NEGATIVE Final     Comment: (NOTE) SARS-CoV-2 target nucleic acids are NOT DETECTED.  The SARS-CoV-2 RNA is generally detectable in upper respiratory specimens during the acute phase of infection. The lowest concentration of SARS-CoV-2 viral copies this assay can detect is 138 copies/mL. A negative result does not preclude SARS-Cov-2 infection and should not be used as the sole basis for treatment or other patient management decisions. A negative result may occur with  improper specimen collection/handling, submission of specimen other than nasopharyngeal swab, presence of viral mutation(s) within the areas targeted by this assay, and inadequate number of viral copies(<138 copies/mL). A negative result must be combined with clinical observations, patient history, and epidemiological information. The expected result is Negative.  Fact Sheet for Patients:  04/07/21  Fact Sheet for Healthcare Providers:  BloggerCourse.com  This test is no t yet approved or cleared by the [place] FDA and  has been authorized for detection and/or diagnosis of SARS-CoV-2 by  FDA under an Emergency Use Authorization (EUA). This EUA will remain  in effect (meaning this test can be used) for the duration of the COVID-19 declaration under Section 564(b)(1) of the Act, 21 U.S.C.section 360bbb-3(b)(1), unless the authorization is terminated  or revoked sooner.       Influenza A by PCR NEGATIVE NEGATIVE Final   Influenza B by PCR NEGATIVE NEGATIVE Final    Comment: (NOTE) The Xpert Xpress SARS-CoV-2/FLU/RSV plus assay is intended as an aid in the diagnosis of influenza from Nasopharyngeal swab specimens and should not be used as a sole basis for treatment. Nasal washings and aspirates are unacceptable for Xpert Xpress SARS-CoV-2/FLU/RSV testing.  Fact Sheet for Patients: BloggerCourse.com  Fact Sheet for Healthcare  Providers: SeriousBroker.it  This test is not yet approved or cleared by the [place] FDA and has been authorized for detection and/or diagnosis of SARS-CoV-2 by FDA under an Emergency Use Authorization (EUA). This EUA will remain in effect (meaning this test can be used) for the duration of the COVID-19 declaration under Section 564(b)(1) of the Act, 21 U.S.C. section 360bbb-3(b)(1), unless the authorization is terminated or revoked.  Performed at [facility], [address] [place], [place] [address]      Labs: BNP (last 3 results) No results for input(s): BNP in the last 8760 hours. Basic Metabolic Panel: Recent Labs  Lab 04/05/21 2138 04/06/21 0510 04/07/21 0448  NA 137 138 136  K 3.9 4.9 3.8  CL 110 111 105  CO2 16* 20* 25  GLUCOSE 102* 91 96  BUN 35* 30* 21  CREATININE 1.76* 1.42* 1.38*  CALCIUM 8.0* 8.1* 8.7*   Liver Function Tests: Recent Labs  Lab 04/05/21 2138  AST 17  ALT 13  ALKPHOS 56  BILITOT 0.7  PROT 6.4*  ALBUMIN 3.3*   No results for input(s): LIPASE, AMYLASE in the last 168 hours. No results for input(s): AMMONIA in the last 168 hours. CBC: Recent Labs  Lab 04/05/21 2138 04/06/21 0510 04/07/21 0448  WBC 9.7 19.6* 11.1*  HGB 11.9* 12.4* 12.1*  HCT 38.4* 37.0* 36.4*  MCV 93.9 93.0 89.4  PLT 345 321 311   Cardiac Enzymes: No results for input(s): CKTOTAL, CKMB, CKMBINDEX, TROPONINI in the last 168 hours. BNP: Invalid input(s): POCBNP CBG: Recent Labs  Lab 04/05/21 2133  GLUCAP 109*   D-Dimer No results for input(s): DDIMER in the last 72 hours. Hgb A1c No results for input(s): HGBA1C in the last 72 hours. Lipid Profile No results for input(s): CHOL, HDL, LDLCALC, TRIG, CHOLHDL, LDLDIRECT in the last 72 hours. Thyroid function studies No results for input(s): TSH, T4TOTAL, T3FREE, THYROIDAB in the last 72 hours.  Invalid input(s): FREET3 Anemia work up No results for input(s):  VITAMINB12, FOLATE, FERRITIN, TIBC, IRON, RETICCTPCT in the last 72 hours. Urinalysis    Component Value Date/Time   COLORURINE STRAW (A) 04/05/2021 2351   APPEARANCEUR CLEAR (A) 04/05/2021 2351   LABSPEC 1.013 04/05/2021 2351   PHURINE 6.0 04/05/2021 2351   GLUCOSEU NEGATIVE 04/05/2021 2351   HGBUR NEGATIVE 04/05/2021 2351   BILIRUBINUR NEGATIVE 04/05/2021 2351   KETONESUR NEGATIVE 04/05/2021 2351   PROTEINUR 30 (A) 04/05/2021 2351   NITRITE NEGATIVE 04/05/2021 2351   LEUKOCYTESUR NEGATIVE 04/05/2021 2351   Sepsis Labs Invalid input(s): PROCALCITONIN,  WBC,  LACTICIDVEN Microbiology Recent Results (from the past 240 hour(s))  Resp Panel by RT-PCR (Flu A&B, Covid) Nasopharyngeal Swab     Status: None   Collection Time: 04/05/21  9:38  PM   Specimen: Nasopharyngeal Swab; Nasopharyngeal(NP) swabs in vial transport medium  Result Value Ref Range Status   SARS Coronavirus 2 by RT PCR NEGATIVE NEGATIVE Final    Comment: (NOTE) SARS-CoV-2 target nucleic acids are NOT DETECTED.  The SARS-CoV-2 RNA is generally detectable in upper respiratory specimens during the acute phase of infection. The lowest concentration of SARS-CoV-2 viral copies this assay can detect is 138 copies/mL. A negative result does not preclude SARS-Cov-2 infection and should not be used as the sole basis for treatment or other patient management decisions. A negative result may occur with  improper specimen collection/handling, submission of specimen other than nasopharyngeal swab, presence of viral mutation(s) within the areas targeted by this assay, and inadequate number of viral copies(<138 copies/mL). A negative result must be combined with clinical observations, patient history, and epidemiological information. The expected result is Negative.  Fact Sheet for Patients:  BloggerCourse.com  Fact Sheet for Healthcare Providers:  [employer]ious[employer]ker.it  This test  is no t yet approved or cleared by the [place] FDA and  has been authorized for detection and/or diagnosis of SARS-CoV-2 by FDA under an Emergency Use Authorization (EUA). This EUA will remain  in effect (meaning this test can be used) for the duration of the COVID-19 declaration under Section 564(b)(1) of the Act, 21 U.S.C.section 360bbb-3(b)(1), unless the authorization is terminated  or revoked sooner.       Influenza A by PCR NEGATIVE NEGATIVE Final   Influenza B by PCR NEGATIVE NEGATIVE Final    Comment: (NOTE) The Xpert Xpress SARS-CoV-2/FLU/RSV plus assay is intended as an aid in the diagnosis of influenza from Nasopharyngeal swab specimens and should not be used as a sole basis for treatment. Nasal washings and aspirates are unacceptable for Xpert Xpress SARS-CoV-2/FLU/RSV testing.  Fact Sheet for Patients: BloggerCourse.com  Fact Sheet for Healthcare Providers: SeriousBroker.it  This test is not yet approved or cleared by the [place] FDA and has been authorized for detection and/or diagnosis of SARS-CoV-2 by FDA under an Emergency Use Authorization (EUA). This EUA will remain in effect (meaning this test can be used) for the duration of the COVID-19 declaration under Section 564(b)(1) of the Act, 21 U.S.C. section 360bbb-3(b)(1), unless the authorization is terminated or revoked.  Performed at [facility], [address], [place], [place] [address]      Time coordinating discharge: Over 30 minutes  SIGNED:   Charise Killian, MD  [facility] 04/07/2021, 11:40 AM Pager   If 7PM-7AM, please contact night-coverage

## 2021-04-07 NOTE — Progress Notes (Signed)
Mobility Specialist - Progress Note   04/07/21 1100  Orthostatic Lying   BP- Lying 145/88  Orthostatic Sitting  BP- Sitting (!) 152/92  Orthostatic Standing at 0 minutes  BP- Standing at 0 minutes 172/86  Mobility  Activity Ambulated in hall  Level of Assistance Independent  Assistive Device None  Distance Ambulated (ft) 180 ft  Mobility Ambulated independently in hallway  Mobility Response Tolerated well  Mobility performed by Mobility specialist  $Mobility charge 1 Mobility    Pre-mobility: 79 HR, 99% SpO2 During mobility: 100 HR Post-mobility: 85 HR, 100% SpO2   Pt was lying in bed upon arrival, utilizing RA. Orthostatics recorded above. Denied dizziness throughout session. No AD, no LOB. Denied SOB. BP after ambulation = 185/101 EOB which did decrease to 160/91 once returned supine.    Travis Payne Mobility Specialist 04/07/21, 11:25 AM

## 2021-04-07 NOTE — Progress Notes (Signed)
Patient has been cleared by MD for discharge. Vital signs WNL. Patient belongings returned. IV removed. Patient given discharge instructions and instructions reviewed with patient. No questions or concerns.

## 2022-05-28 IMAGING — CT CT HEAD W/O CM
3 series · 16 of 47 positions shown, 19 images · non-contrast
Comparison: None.

CLINICAL DATA: Syncope, hypotension

EXAM:
CT HEAD WITHOUT CONTRAST
TECHNIQUE: Contiguous axial images were obtained from the base of the skull
through the vertex without intravenous contrast.

[Series 2: head wo · axial · 0.44mm/px · z∈[-93,+42]mm · 10 of 33 slices shown, 13 images]
[im 3/33  brain]
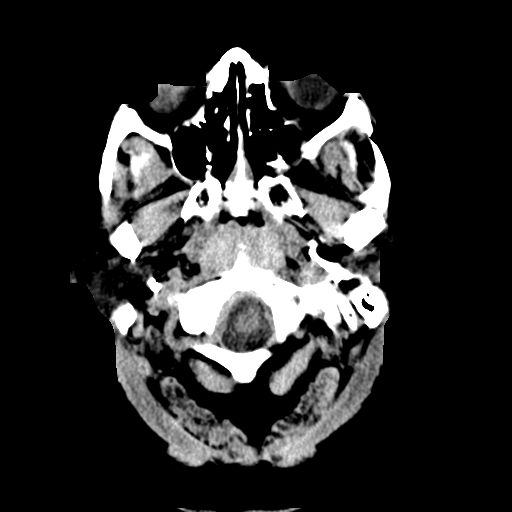
[im 3/33  bone]
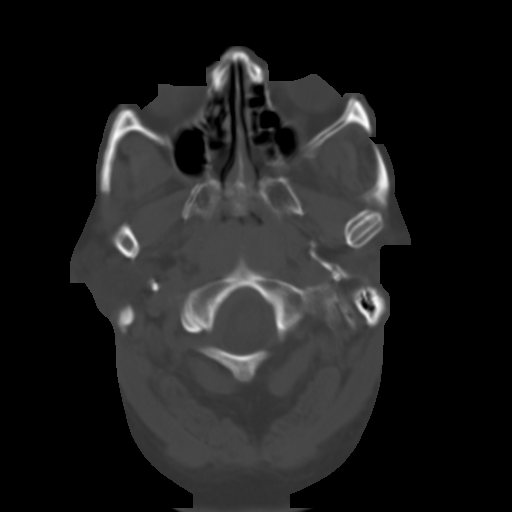
[im 6/33  brain]
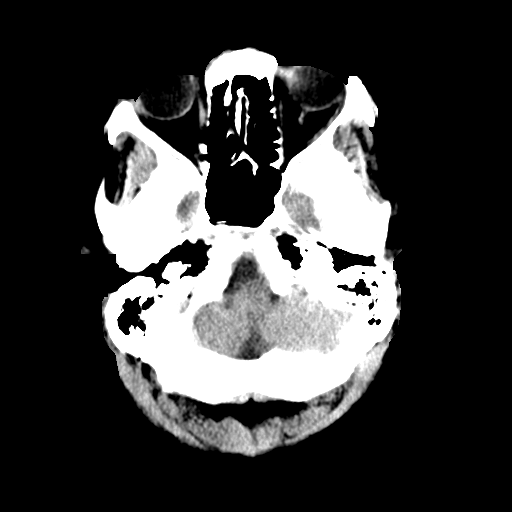
[im 9/33  brain]
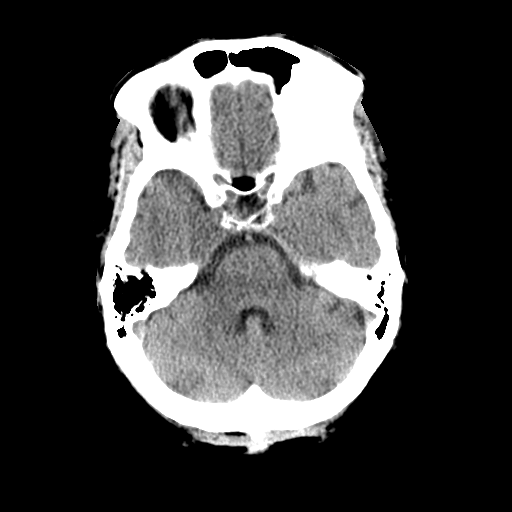
[im 12/33  brain]
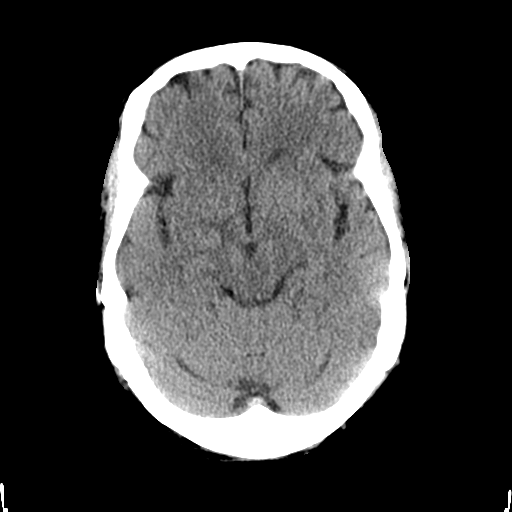
[im 15/33  brain]
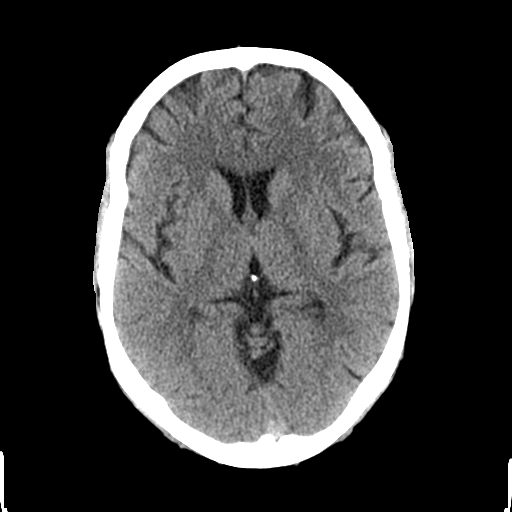
[im 15/33  bone]
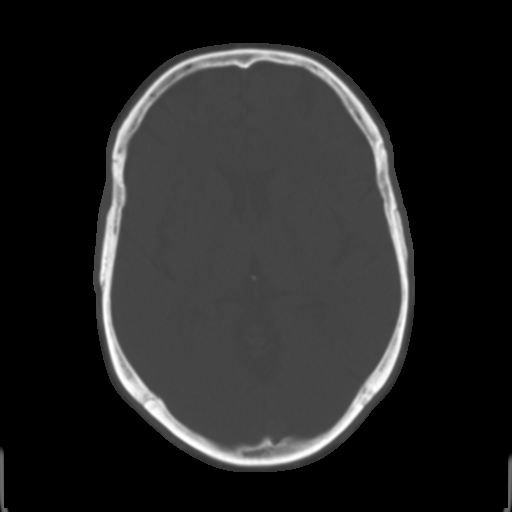
[im 18/33  brain]
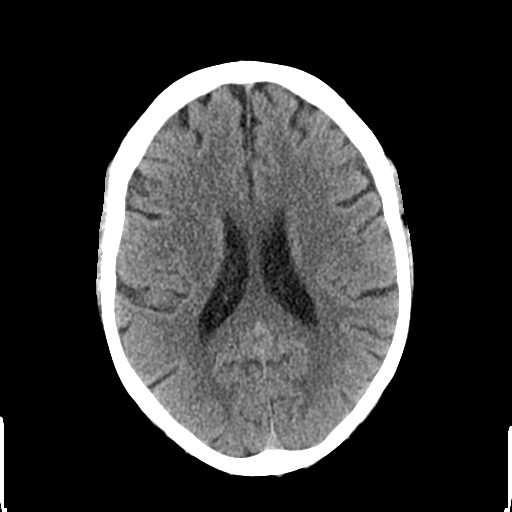
[im 21/33  brain]
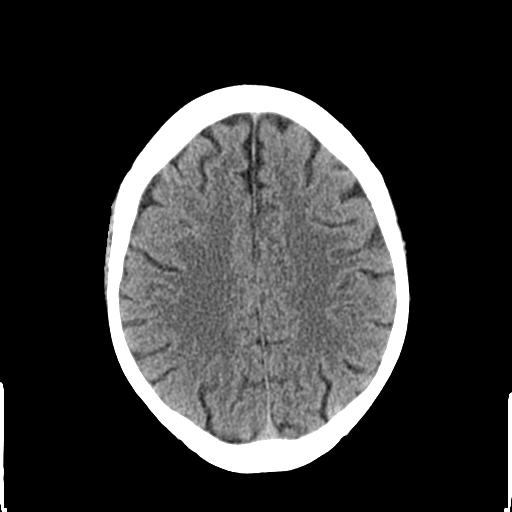
[im 25/33  brain]
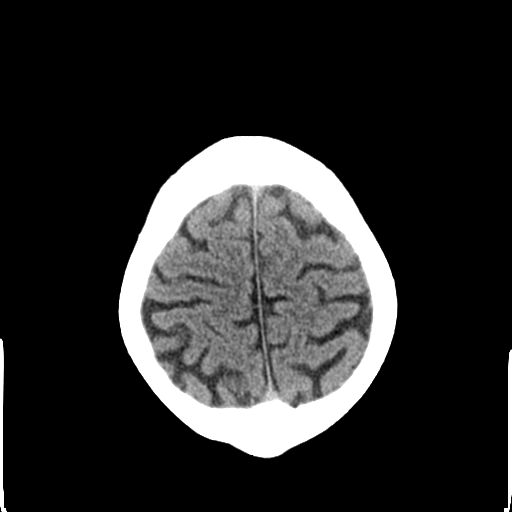
[im 27/33  brain]
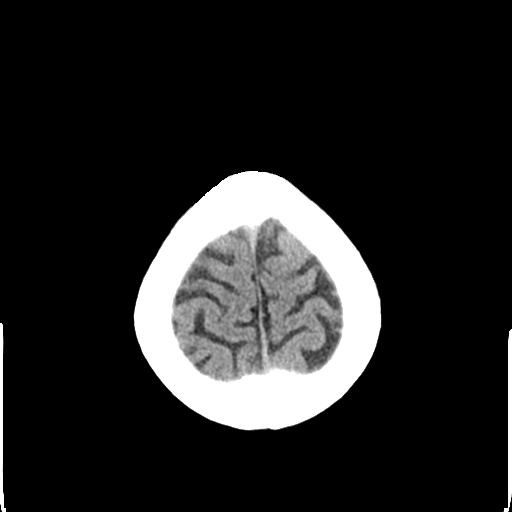
[im 27/33  bone]
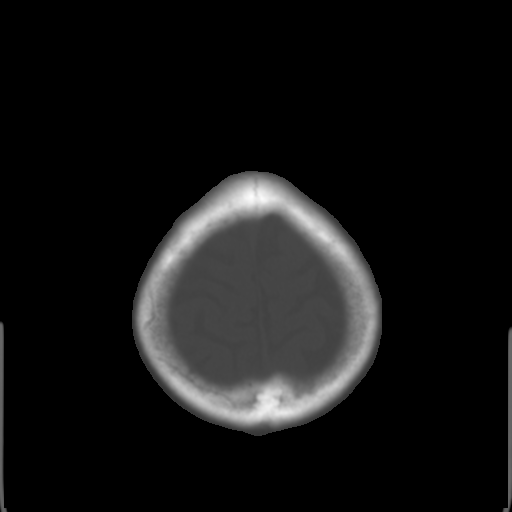
[im 30/33  brain]
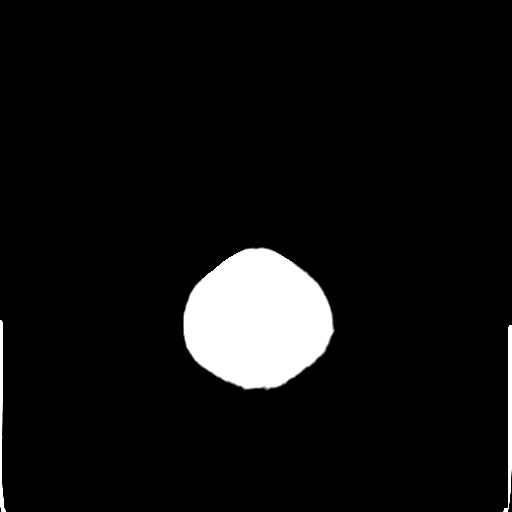

[Series 4: coronal soft tissue · coronal · 0.33mm/px · 3 of 70 slices shown]
[im 24/70  brain]
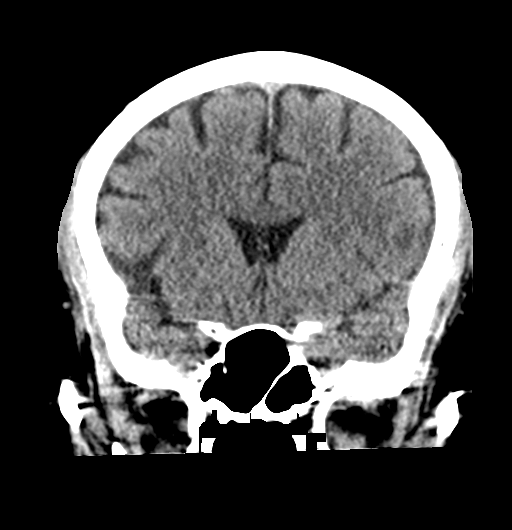
[im 31/70  brain]
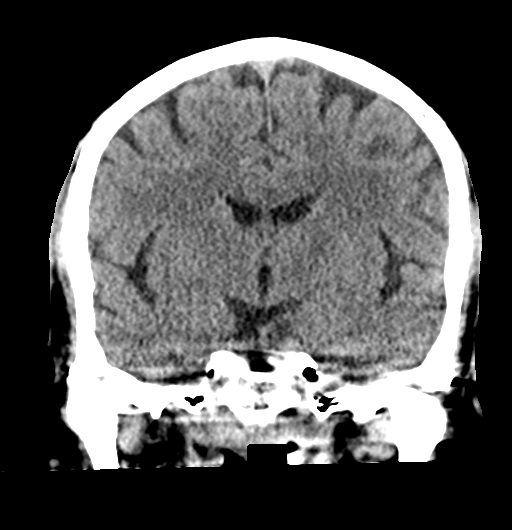
[im 39/70  brain]
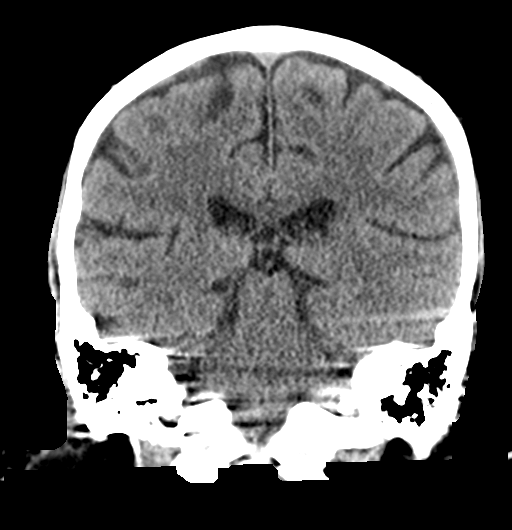

[Series 5: sagittal soft tissue · sagittal · 0.34mm/px · 3 of 55 slices shown]
[im 19/55  brain]
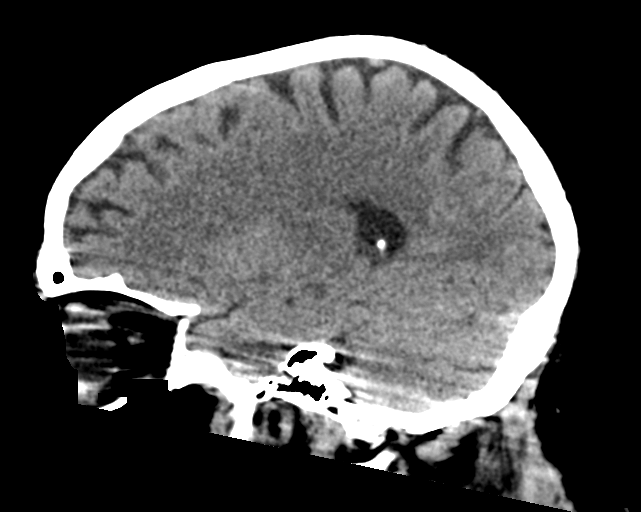
[im 28/55  brain]
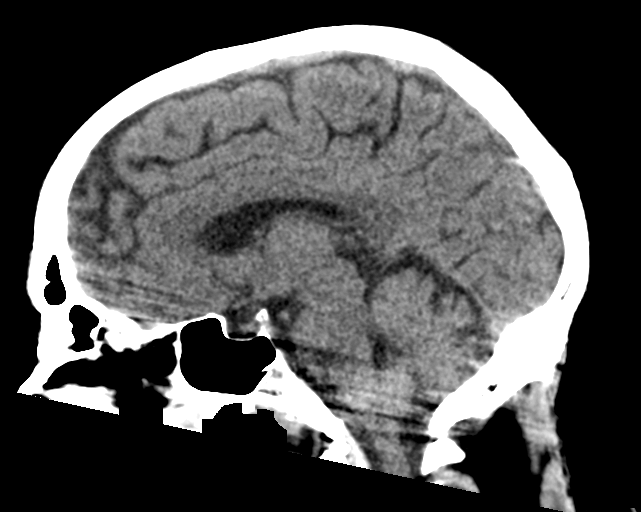
[im 37/55  brain]
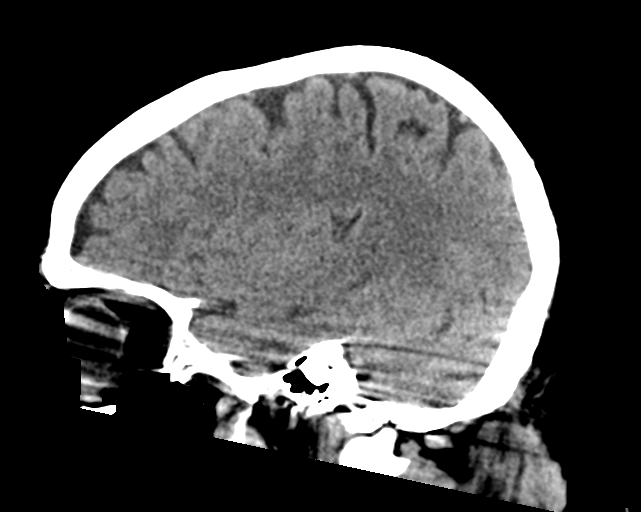

[16 of 47 positions shown; findings below may reference images not displayed]

FINDINGS: Brain: No acute infarct or hemorrhage. Lateral ventricles and
midline structures are unremarkable. No acute extra-axial fluid
collections. No mass effect.

Vascular: No hyperdense vessel or unexpected calcification.

Skull: Normal. Negative for fracture or focal lesion.

Sinuses/Orbits: No acute finding.

Other: None.
IMPRESSION: 1. No acute intracranial process.
